# Patient Record
Sex: Male | Born: 2000 | Race: White | Hispanic: No | Marital: Single | Smoking: Never smoker
Health system: Southern US, Community
[De-identification: ages and names within clinical notes are randomized; demographics above are authoritative.]

## PROBLEM LIST (undated history)

## (undated) DIAGNOSIS — J45909 Unspecified asthma, uncomplicated: Secondary | ICD-10-CM

## (undated) DIAGNOSIS — J302 Other seasonal allergic rhinitis: Secondary | ICD-10-CM

## (undated) HISTORY — DX: Unspecified asthma, uncomplicated: J45.909

## (undated) HISTORY — DX: Other seasonal allergic rhinitis: J30.2

## (undated) HISTORY — PX: NO PAST SURGERIES: SHX2092

---
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
## 2019-11-29 ENCOUNTER — Ambulatory Visit: Payer: Self-pay

## 2019-11-29 ENCOUNTER — Other Ambulatory Visit: Payer: Self-pay | Admitting: Family Medicine

## 2019-11-29 ENCOUNTER — Other Ambulatory Visit: Payer: Self-pay

## 2019-11-29 DIAGNOSIS — Z0000 Encounter for general adult medical examination without abnormal findings: Secondary | ICD-10-CM

## 2019-12-16 ENCOUNTER — Telehealth: Payer: Self-pay | Admitting: Physician Assistant

## 2019-12-16 NOTE — Telephone Encounter (Signed)
This was printed and placed at the front desk.

## 2019-12-16 NOTE — Telephone Encounter (Signed)
Patient would like copy of his vaccination record.  He would like to be able to pick it up on Monday, 12/19/2019.  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

## 2022-01-22 IMAGING — DX DG CHEST 2V
2 series · 2 of 2 positions shown · non-contrast
Comparison: None.

CLINICAL DATA: Physical exam

EXAM:
CHEST - 2 VIEW

[chest pa]
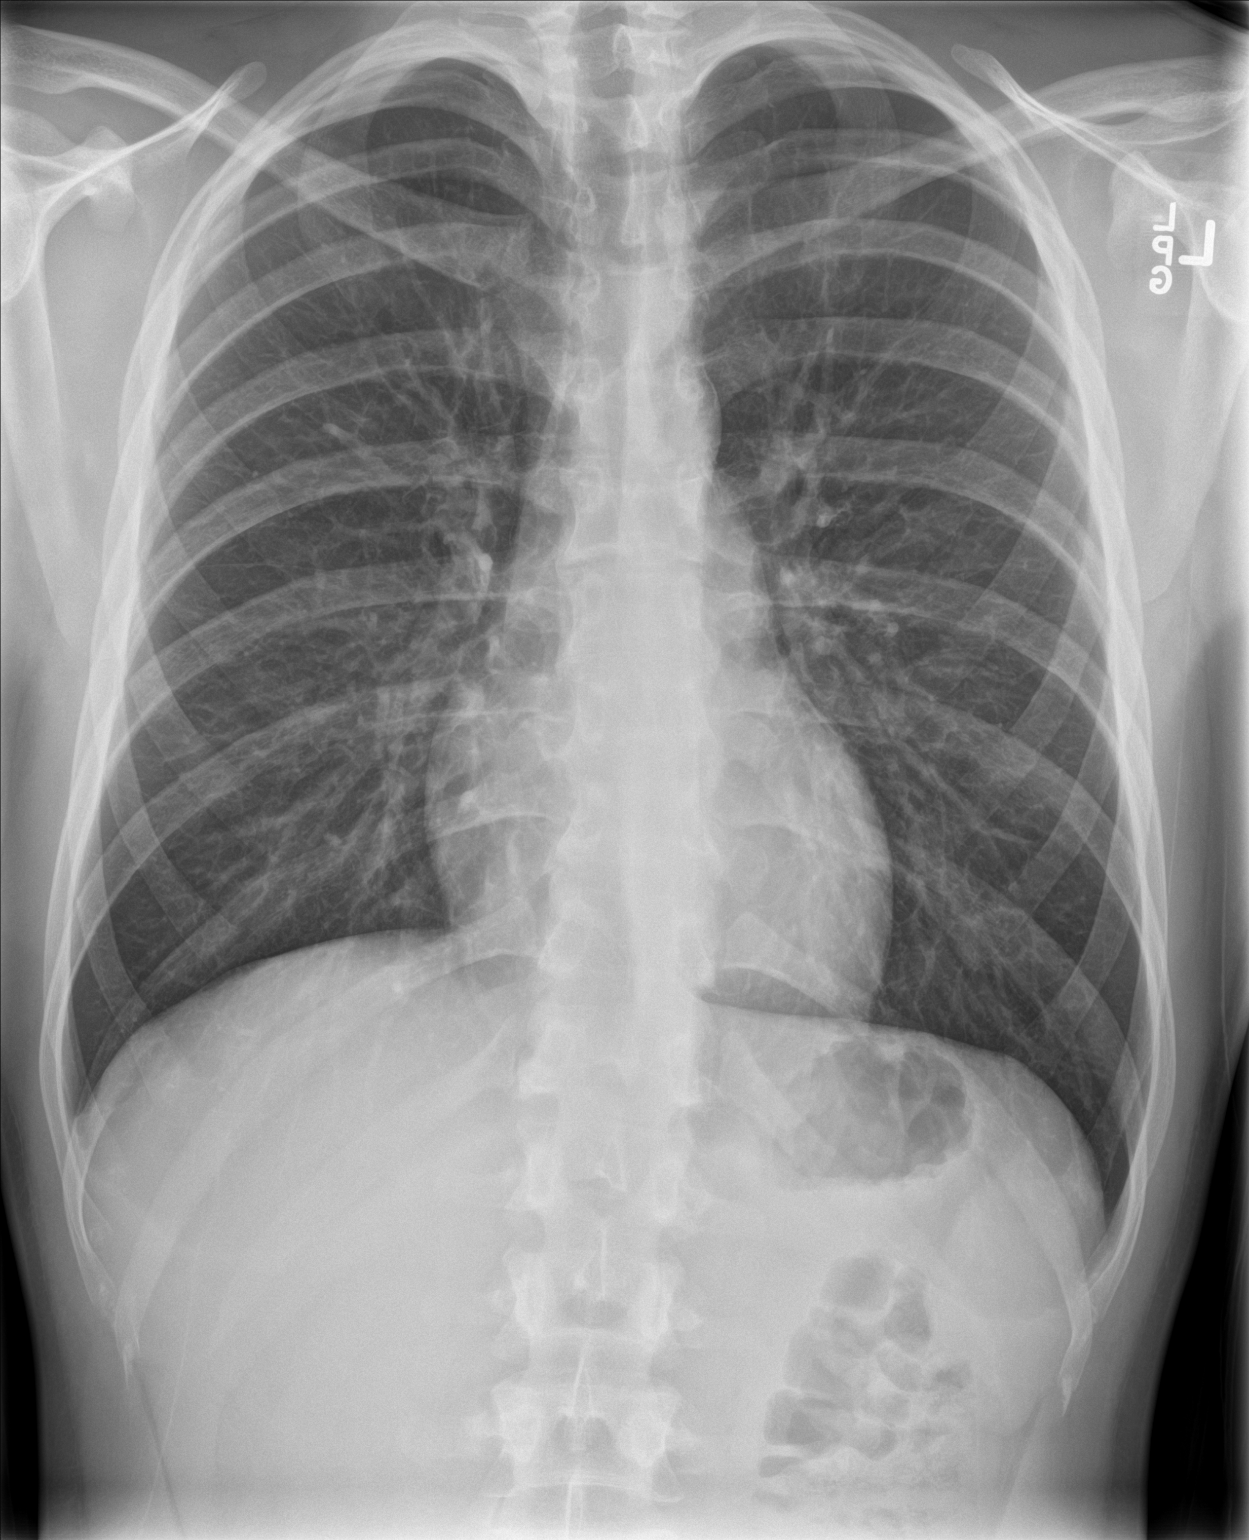

[chest lat]
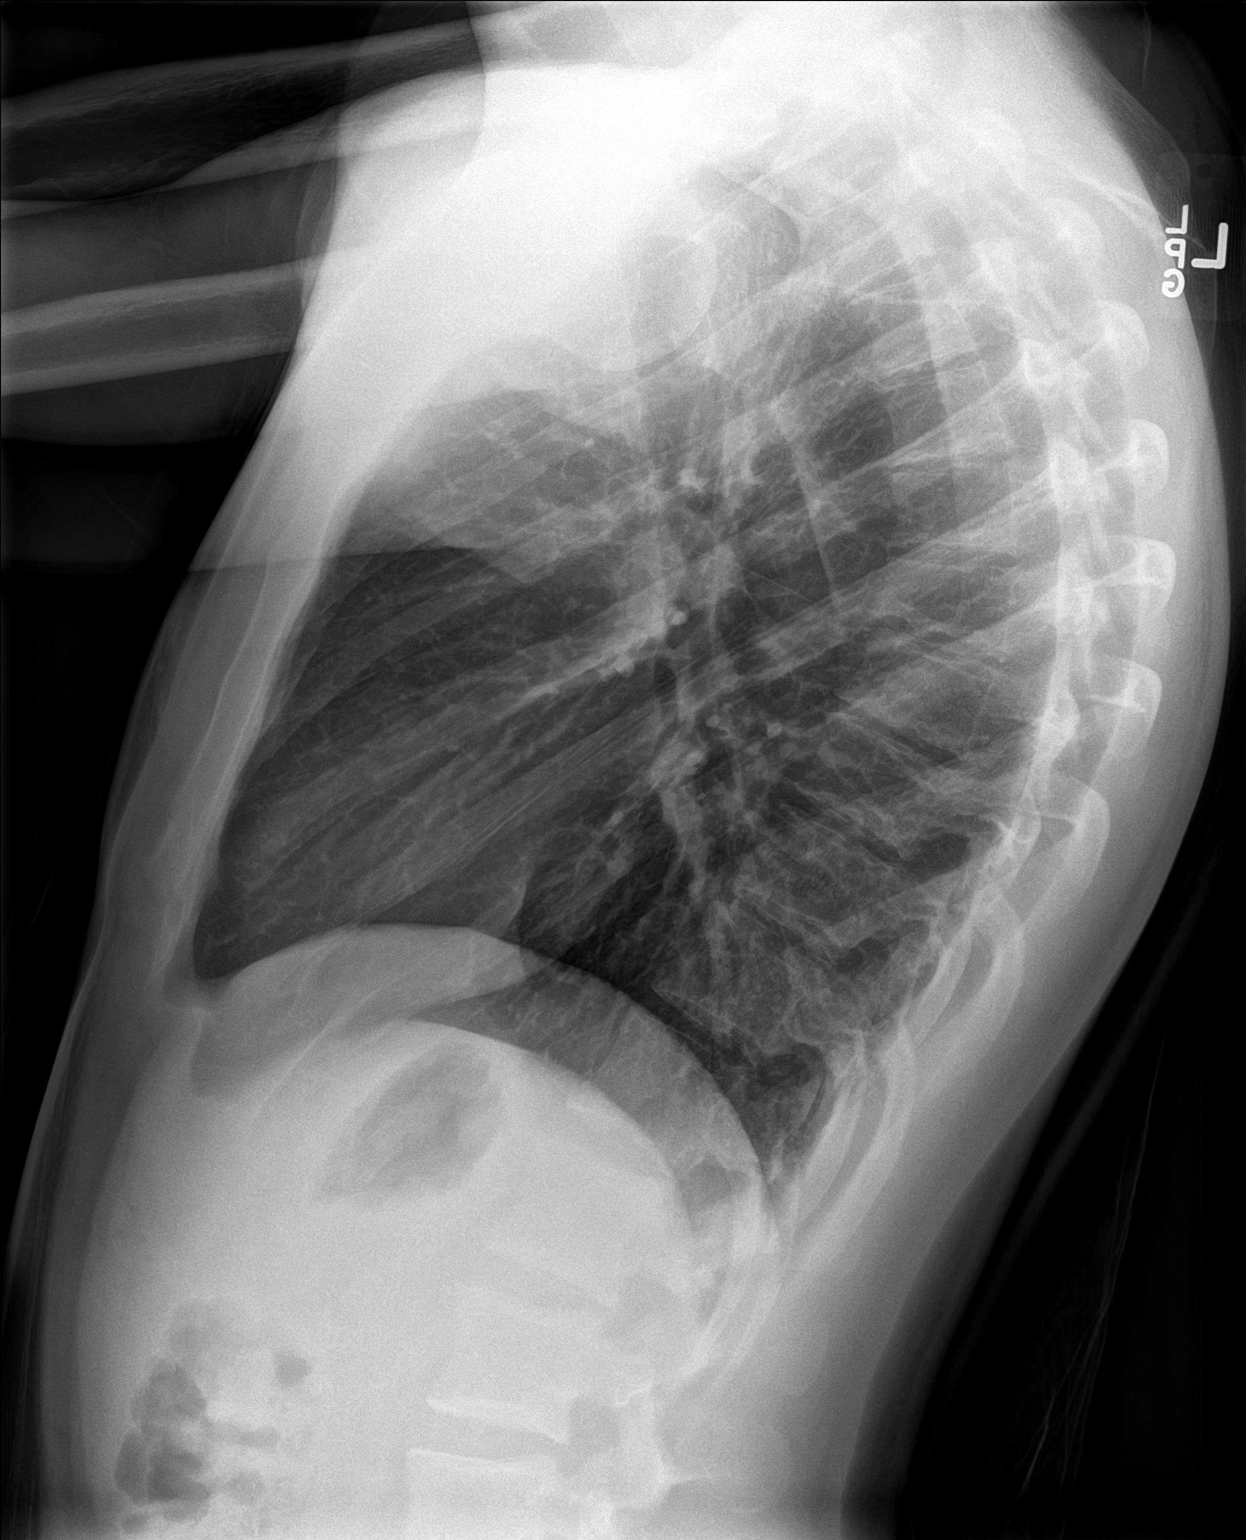

[2 of 2 positions shown; findings below may reference images not displayed]

FINDINGS: The heart size and mediastinal contours are within normal limits.
Both lungs are clear. The visualized skeletal structures are
unremarkable.
IMPRESSION: Normal study.
